# Patient Record
Sex: Male | Born: 2005 | Race: Black or African American | Hispanic: No | Marital: Single | Smoking: Never smoker
Health system: Southern US, Community
[De-identification: ages and names within clinical notes are randomized; demographics above are authoritative.]

## PROBLEM LIST (undated history)

## (undated) ENCOUNTER — Ambulatory Visit (HOSPITAL_COMMUNITY): Admission: EM | Payer: No Typology Code available for payment source | Source: Home / Self Care

## (undated) ENCOUNTER — Emergency Department (HOSPITAL_COMMUNITY): Admission: EM | Payer: Medicaid Other | Source: Home / Self Care

---
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
## 2024-05-01 ENCOUNTER — Ambulatory Visit (INDEPENDENT_AMBULATORY_CARE_PROVIDER_SITE_OTHER): Payer: Self-pay

## 2024-05-01 DIAGNOSIS — Z23 Encounter for immunization: Secondary | ICD-10-CM | POA: Diagnosis present

## 2024-05-01 NOTE — Progress Notes (Signed)
 Patient presents to nurse clinic for flu vaccine with his family.  Vaccine given without complication.  See admin for details.
